# Patient Record
Sex: Male | Born: 2000
Health system: Southern US, Community
[De-identification: ages and names within clinical notes are randomized; demographics above are authoritative.]

## PROBLEM LIST (undated history)

## (undated) DIAGNOSIS — J45909 Unspecified asthma, uncomplicated: Secondary | ICD-10-CM

## (undated) DIAGNOSIS — S060X9A Concussion with loss of consciousness of unspecified duration, initial encounter: Secondary | ICD-10-CM

## (undated) DIAGNOSIS — S060XAA Concussion with loss of consciousness status unknown, initial encounter: Secondary | ICD-10-CM

## (undated) HISTORY — DX: Unspecified asthma, uncomplicated: J45.909

---
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
## 2020-11-15 ENCOUNTER — Telehealth: Payer: Self-pay

## 2020-11-15 NOTE — Telephone Encounter (Signed)
Copied from CRM [id number]. Topic: General - Other >> Nov 15, 2020  8:37 AM Jerry Smith A wrote: Reason for CRM: Patient mom called to request a call back for him to be seen by the new provider when she come in please call Ph# [phone]

## 2020-11-15 NOTE — Telephone Encounter (Signed)
LMTCB 11/15/2020.  PEC please transfer to [facility] so we can schedule pt with Remo Lipps,   -Vernona Rieger

## 2020-11-16 NOTE — Telephone Encounter (Signed)
Appt sch for 04/22/21 at 8:40 a.m.

## 2021-04-22 ENCOUNTER — Encounter: Payer: No Typology Code available for payment source | Admitting: Family Medicine

## 2023-04-04 DIAGNOSIS — R519 Headache, unspecified: Secondary | ICD-10-CM | POA: Diagnosis not present

## 2023-04-04 DIAGNOSIS — S199XXA Unspecified injury of neck, initial encounter: Secondary | ICD-10-CM | POA: Diagnosis not present

## 2023-04-04 DIAGNOSIS — S0990XA Unspecified injury of head, initial encounter: Secondary | ICD-10-CM | POA: Diagnosis not present

## 2023-04-04 DIAGNOSIS — S161XXA Strain of muscle, fascia and tendon at neck level, initial encounter: Secondary | ICD-10-CM | POA: Diagnosis not present

## 2023-04-09 ENCOUNTER — Telehealth: Payer: Self-pay

## 2023-04-16 ENCOUNTER — Telehealth: Payer: Self-pay | Admitting: Family Medicine

## 2023-04-19 ENCOUNTER — Ambulatory Visit: Payer: 59 | Admitting: Family Medicine

## 2023-04-19 ENCOUNTER — Encounter: Payer: Self-pay | Admitting: Family Medicine

## 2023-04-19 VITALS — BP 107/73 | HR 76 | Ht 72.0 in | Wt 172.8 lb

## 2023-04-19 DIAGNOSIS — S060X0D Concussion without loss of consciousness, subsequent encounter: Secondary | ICD-10-CM | POA: Diagnosis not present

## 2023-04-19 DIAGNOSIS — M542 Cervicalgia: Secondary | ICD-10-CM

## 2023-04-19 DIAGNOSIS — S060X0A Concussion without loss of consciousness, initial encounter: Secondary | ICD-10-CM | POA: Insufficient documentation

## 2023-04-19 DIAGNOSIS — Z7689 Persons encountering health services in other specified circumstances: Secondary | ICD-10-CM | POA: Insufficient documentation
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
## 2023-04-19 NOTE — Assessment & Plan Note (Addendum)
-  Improving headaches and light sensitivity - Can take a few weeks to symptoms to go away. - continue to take Ibuprofen/ tylenol for headache mgmt - Return to work given  Healing well - discussed if symptoms worsen or continue - please follow up sooner, may need further eval by Neurology.  Continue Treatment:  - Preventing further injury - While you are healing, it's important not to do too much, especially activities that could lead to another head injury, like organized sports. Having a second injury while the brain is healing from a concussion can seriously damage the brain. - Physical rest - Rest your body, and get plenty of sleep. Avoid heavy exercise or too much physical activity if it makes you feel worse. - Mental rest -  Avoid doing activities that need concentration or a lot of attention if they make you feel worse. Sometimes, activities using screens, especially videogames, can make people feel worse after a concussion. You can start doing these things again as you get better. - Avoiding alcohol and cannabis while you are still having symptoms of concussion  Prevention: - Wear a helmet when you ride a bike or motorcycle, or play certain sports. - Wear a seat belt when you drive or ride in a car.  Emergent needs: - Cannot be fully woken up - Are acting confused or disoriented - Have a sudden and persistent change in your behavior - Cannot walk normally - Have trouble speaking or slurred speech - Have severe weakness or cannot move an arm, leg, or 1 side of your face - Have a seizure or jerking of your arms or legs you cannot control
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
## 2023-04-19 NOTE — Assessment & Plan Note (Signed)
Cervical Pain improving, no limitation with ROM or pain during visit.  - continue as needed ibuprofen and robaxin.  - Continue heat and ice as needed - rest - Decline refill today.  - If worsens or continues - discussed referral to PT.

## 2023-10-06 ENCOUNTER — Telehealth: Payer: Self-pay | Admitting: Family

## 2023-10-06 DIAGNOSIS — L639 Alopecia areata, unspecified: Secondary | ICD-10-CM

## 2023-10-06 MED ORDER — CLOBETASOL PROPIONATE 0.05 % EX CREA: 1.0000 | TOPICAL_CREAM | Freq: Two times a day (BID) | CUTANEOUS | 0 refills | Status: AC

## 2023-10-06 NOTE — Progress Notes (Signed)
 E Visit for Rash  We are sorry that you are not feeling well. Here is how we plan to help!  Alopecia areata is an autoimmune disease causing hair loss, typically in small, round patches on the scalp or other areas of the body. It is characterized by the immune system mistakenly attacking hair follicles, leading to hair shedding.   You can see your PCP and request a referral to dermatologists and get steroid injections.    HOME CARE:  Take cool showers and avoid direct sunlight. Apply cool compress or wet dressings. Take a bath in an oatmeal bath.  Sprinkle content of one Aveeno packet under running faucet with comfortably warm water.  Bathe for 15-20 minutes, 1-2 times daily.  Pat dry with a towel. Do not rub the rash. Use hydrocortisone cream. Take an antihistamine like Benadryl for widespread rashes that itch.  The adult dose of Benadryl is 25-50 mg by mouth 4 times daily. Caution:  This type of medication may cause sleepiness.  Do not drink alcohol, drive, or operate dangerous machinery while taking antihistamines.  Do not take these medications if you have prostate enlargement.  Read package instructions thoroughly on all medications that you take.  GET HELP RIGHT AWAY IF:  Symptoms don't go away after treatment. Severe itching that persists. If you rash spreads or swells. If you rash begins to smell. If it blisters and opens or develops a yellow-brown crust. You develop a fever. You have a sore throat. You become short of breath.  MAKE SURE YOU:  Understand these instructions. Will watch your condition. Will get help right away if you are not doing well or get worse.  Thank you for choosing an e-visit.  Your e-visit answers were reviewed by a board certified advanced clinical practitioner to complete your personal care plan. Depending upon the condition, your plan could have included both over the counter or prescription medications.  Please review your pharmacy choice.  Make sure the pharmacy is open so you can pick up prescription now. If there is a problem, you may contact your provider through [employer] and have the prescription routed to another pharmacy.  Your safety is important to us . If you have drug allergies check your prescription carefully.   For the next 24 hours you can use MyChart to ask questions about today's visit, request a non-urgent call back, or ask for a work or school excuse. You will get an email in the next two days asking about your experience. I hope that your e-visit has been valuable and will speed your recovery.  Approximately 5 minutes was spent documenting and reviewing patient's chart.
